# Patient Record
Sex: Male | Born: 1965
Health system: Southern US, Community
[De-identification: ages and names within clinical notes are randomized; demographics above are authoritative.]

## PROBLEM LIST (undated history)

## (undated) DIAGNOSIS — T7840XA Allergy, unspecified, initial encounter: Secondary | ICD-10-CM

## (undated) HISTORY — PX: WISDOM TOOTH EXTRACTION: SHX21

## (undated) HISTORY — PX: INGUINAL HERNIA REPAIR: SUR1180

## (undated) HISTORY — DX: Allergy, unspecified, initial encounter: T78.40XA

---

## 2006-11-03 ENCOUNTER — Ambulatory Visit (HOSPITAL_COMMUNITY): Admission: RE | Admit: 2006-11-03 | Discharge: 2006-11-03 | Payer: Self-pay | Admitting: Cardiology

## 2007-10-24 ENCOUNTER — Encounter: Admission: RE | Admit: 2007-10-24 | Discharge: 2007-10-24 | Payer: Self-pay | Admitting: Surgery

## 2016-05-27 DIAGNOSIS — M7651 Patellar tendinitis, right knee: Secondary | ICD-10-CM | POA: Diagnosis not present

## 2016-12-08 DIAGNOSIS — S81802A Unspecified open wound, left lower leg, initial encounter: Secondary | ICD-10-CM | POA: Diagnosis not present

## 2016-12-12 DIAGNOSIS — S81812A Laceration without foreign body, left lower leg, initial encounter: Secondary | ICD-10-CM | POA: Diagnosis not present

## 2017-01-12 DIAGNOSIS — Z125 Encounter for screening for malignant neoplasm of prostate: Secondary | ICD-10-CM | POA: Diagnosis not present

## 2017-01-12 DIAGNOSIS — Z0000 Encounter for general adult medical examination without abnormal findings: Secondary | ICD-10-CM | POA: Diagnosis not present

## 2017-01-18 DIAGNOSIS — Z6824 Body mass index (BMI) 24.0-24.9, adult: Secondary | ICD-10-CM | POA: Diagnosis not present

## 2017-01-18 DIAGNOSIS — Z1389 Encounter for screening for other disorder: Secondary | ICD-10-CM | POA: Diagnosis not present

## 2017-01-18 DIAGNOSIS — Z0000 Encounter for general adult medical examination without abnormal findings: Secondary | ICD-10-CM | POA: Diagnosis not present

## 2017-08-28 DIAGNOSIS — H10021 Other mucopurulent conjunctivitis, right eye: Secondary | ICD-10-CM | POA: Diagnosis not present

## 2017-08-28 DIAGNOSIS — J0100 Acute maxillary sinusitis, unspecified: Secondary | ICD-10-CM | POA: Diagnosis not present

## 2017-09-08 ENCOUNTER — Encounter: Payer: Self-pay | Admitting: Internal Medicine

## 2018-01-23 DIAGNOSIS — Z125 Encounter for screening for malignant neoplasm of prostate: Secondary | ICD-10-CM | POA: Diagnosis not present

## 2018-01-23 DIAGNOSIS — R82998 Other abnormal findings in urine: Secondary | ICD-10-CM | POA: Diagnosis not present

## 2018-01-23 DIAGNOSIS — Z0000 Encounter for general adult medical examination without abnormal findings: Secondary | ICD-10-CM | POA: Diagnosis not present

## 2018-01-30 DIAGNOSIS — Z0000 Encounter for general adult medical examination without abnormal findings: Secondary | ICD-10-CM | POA: Diagnosis not present

## 2018-01-30 DIAGNOSIS — G5760 Lesion of plantar nerve, unspecified lower limb: Secondary | ICD-10-CM | POA: Diagnosis not present

## 2018-02-02 DIAGNOSIS — Z1212 Encounter for screening for malignant neoplasm of rectum: Secondary | ICD-10-CM | POA: Diagnosis not present

## 2018-02-20 DIAGNOSIS — Z7289 Other problems related to lifestyle: Secondary | ICD-10-CM | POA: Diagnosis not present

## 2018-02-20 DIAGNOSIS — J343 Hypertrophy of nasal turbinates: Secondary | ICD-10-CM | POA: Diagnosis not present

## 2018-02-20 DIAGNOSIS — J342 Deviated nasal septum: Secondary | ICD-10-CM | POA: Diagnosis not present

## 2018-05-13 DIAGNOSIS — Z23 Encounter for immunization: Secondary | ICD-10-CM | POA: Diagnosis not present

## 2018-06-28 ENCOUNTER — Encounter: Payer: Self-pay | Admitting: Gastroenterology

## 2018-07-30 ENCOUNTER — Telehealth: Payer: Self-pay | Admitting: Gastroenterology

## 2018-07-30 ENCOUNTER — Telehealth: Payer: Self-pay

## 2018-07-30 NOTE — Telephone Encounter (Signed)
In an effort to slow community spread of COVID-19 by decreasing the number of people coming to the office, I am reviewing charts of my upcoming clinic visits.  When appropriate, I am offering other options including, but are not limited to:  -  Telephone visits -  Rescheduling in-person clinic visits, timing of which to be determined by each patient's clinical scenario and required social distancing and/or quarantine recommendations from government authorities.   After an initial review of this patient's referral and pertinent chart information, I have decided:  Telephone visit with me 07/31/18 at 2.45PM  Patient should be available by phone at that time and in a location where they can speak in privacy to protect their health information.

## 2018-07-30 NOTE — Telephone Encounter (Signed)
Left message for pt that Dr. Myrtie Neither will have a telephone visit with him tomorrow at 2:45pm due to covid-19. Left message for pt to call back and reschedule his visit if he does not want a phone visit.

## 2018-07-31 ENCOUNTER — Other Ambulatory Visit: Payer: Self-pay

## 2018-07-31 ENCOUNTER — Telehealth (INDEPENDENT_AMBULATORY_CARE_PROVIDER_SITE_OTHER): Payer: Self-pay | Admitting: Gastroenterology

## 2018-07-31 DIAGNOSIS — R194 Change in bowel habit: Secondary | ICD-10-CM

## 2018-07-31 NOTE — Patient Instructions (Signed)
You will need colonoscopy for screening purposes around 09/30/18 or before. We will be in contact with you regarding getting this procedure scheduled as soon as possible. Should you not hear from [place] within the next 4 weeks, please call us at [phone].  It was a pleasure to speak you today!   Dr. Myrtie Neither

## 2018-07-31 NOTE — Telephone Encounter (Signed)
Left message for pt regarding phone visit and time.

## 2018-07-31 NOTE — Progress Notes (Signed)
This patient contacted our office requesting a physician telephone consultation regarding clinical questions and/or test results.  The patient and I were the only participants on the phone call, and the patient consented to phone consultation.  Chief complaint: Possible rectal bleeding  Relevant history and results: Mr. Caris was initially referred by his primary care provider screening colonoscopy.  He is a healthy man who takes no medications and has no chronic medical problems. When he spoke to our office to schedule procedure, he was asked if he had had any bleeding.  He reported that on February 19 he had had a dark red stool "like cranberry", having no abdominal pain or altered bowel habit at that time.  He had no chronic abdominal pain, has never seen any possible or definite bleeding prior to that, no such episodes since then.  He suspects it may even have been something that he ate the change the color of his stool.   Assessment and plan: A single episode of altered stool character, not clear that it was really bleeding.  He is in need of a screening colonoscopy.  In the event that what he described had been bleeding, we will get it scheduled within 8 weeks.  We are currently delaying nonurgent procedures for at least 4 weeks due to COVID-19.  This encounter will be sent to clinical staff to contact the patient and schedule the colonoscopy.  Mr. Loiseau was also asked to call us if he has not received a phone call from our clinic within 4 weeks.  Total encounter time: 11 minutes   Amada Jupiter, MD

## 2018-08-30 ENCOUNTER — Telehealth: Payer: Self-pay | Admitting: *Deleted

## 2018-08-30 NOTE — Telephone Encounter (Signed)
I have left a message for patient to call back. We would like to schedule him for colonoscopy on 09/07/18 with Dr Myrtie Neither (per Dr Myrtie Neither 07/31/18 televisit encounter, patient needed colonoscopy prior to 09/30/18 for screening purposes/single episode of altered stool character). He will need set up for previsit call as well.

## 2018-08-31 NOTE — Telephone Encounter (Signed)
Left message for patient to call back  

## 2018-09-04 NOTE — Telephone Encounter (Signed)
Patient is scheduled for 09/25/18 colonoscopy with Dr Myrtie Neither.

## 2018-09-07 ENCOUNTER — Telehealth: Payer: Self-pay | Admitting: *Deleted

## 2018-09-07 NOTE — Telephone Encounter (Signed)
Pt called back and spoke with Lasana. He confirmed his pv phone call appointment.

## 2018-09-07 NOTE — Telephone Encounter (Signed)
Called patient, no answer. Left a message for him to call me back today. He needs to confirm his pre-visit scheduled for Sep 11, 2018 at 330 pm.

## 2018-09-11 ENCOUNTER — Encounter: Payer: Self-pay | Admitting: Gastroenterology

## 2018-09-11 ENCOUNTER — Ambulatory Visit: Payer: 59 | Admitting: *Deleted

## 2018-09-11 ENCOUNTER — Other Ambulatory Visit: Payer: Self-pay

## 2018-09-11 VITALS — Ht 68.0 in | Wt 155.0 lb

## 2018-09-11 DIAGNOSIS — Z1211 Encounter for screening for malignant neoplasm of colon: Secondary | ICD-10-CM

## 2018-09-11 MED ORDER — PEG-KCL-NACL-NASULF-NA ASC-C 140 G PO SOLR: 1.0000 | ORAL | 0 refills | Status: DC

## 2018-09-11 NOTE — Progress Notes (Signed)
No egg or soy allergy known to patient  No issues with past sedation with any surgeries  or procedures, no intubation problems  No diet pills per patient No home 02 use per patient  No blood thinners per patient  Pt denies issues with constipation  No A fib or A flutter  EMMI video sent to pt's e mail   Pt mailed instruction packet to included paper to complete and mail back to [facility] with addressed and stamped envelope, Emmi video, copy of consent form to read and not return, and instructions. [employer]  coupon mailed in packet. PV completed over the phone. Pt encouraged to call with questions or issues

## 2018-09-25 ENCOUNTER — Encounter: Payer: Self-pay | Admitting: Gastroenterology

## 2018-10-13 ENCOUNTER — Telehealth: Payer: Self-pay | Admitting: *Deleted

## 2018-10-13 NOTE — Telephone Encounter (Signed)
Covid-19 screening questions  Have you traveled in the last 14 days? No. If yes where?  Do you now or have you had a fever in the last 14 days? No.  Do you have any respiratory symptoms of shortness of breath or cough now or in the last 14 days? No.  Do you have any family members or close contacts with diagnosed or suspected Covid-19 in the past 14 days? No.  Have you been tested for Covid-19 and found to be positive? No.       

## 2018-10-16 ENCOUNTER — Other Ambulatory Visit: Payer: Self-pay

## 2018-10-16 ENCOUNTER — Encounter: Payer: Self-pay | Admitting: Gastroenterology

## 2018-10-16 ENCOUNTER — Ambulatory Visit (AMBULATORY_SURGERY_CENTER): Payer: 59 | Admitting: Gastroenterology

## 2018-10-16 VITALS — BP 100/60 | HR 65 | Temp 98.5°F | Resp 15 | Ht 68.0 in | Wt 155.0 lb

## 2018-10-16 DIAGNOSIS — Z1211 Encounter for screening for malignant neoplasm of colon: Secondary | ICD-10-CM | POA: Diagnosis present

## 2018-10-16 MED ORDER — SODIUM CHLORIDE 0.9 % IV SOLN
500.0000 mL | Freq: Once | INTRAVENOUS | Status: DC
Start: 2018-10-16 — End: 2018-10-16

## 2018-10-16 NOTE — Patient Instructions (Addendum)
You will need a repeat colonscopy in 10 years.  YOU HAD AN ENDOSCOPIC PROCEDURE TODAY AT [facility]:   Refer to the procedure report that was given to you for any specific questions about what was found during the examination.  If the procedure report does not answer your questions, please call your gastroenterologist to clarify.  If you requested that your care partner not be given the details of your procedure findings, then the procedure report has been included in a sealed envelope for you to review at your convenience later.  YOU SHOULD EXPECT: Some feelings of bloating in the abdomen. Passage of more gas than usual.  Walking can help get rid of the air that was put into your GI tract during the procedure and reduce the bloating. If you had a lower endoscopy (such as a colonoscopy or flexible sigmoidoscopy) you may notice spotting of blood in your stool or on the toilet paper. If you underwent a bowel prep for your procedure, you may not have a normal bowel movement for a few days.  Please Note:  You might notice some irritation and congestion in your nose or some drainage.  This is from the oxygen used during your procedure.  There is no need for concern and it should clear up in a day or so.  SYMPTOMS TO REPORT IMMEDIATELY:   Following lower endoscopy (colonoscopy or flexible sigmoidoscopy):  Excessive amounts of blood in the stool  Significant tenderness or worsening of abdominal pains  Swelling of the abdomen that is new, acute  Fever of 100F or higher   For urgent or emergent issues, a gastroenterologist can be reached at any hour by calling [phone].   DIET:  We do recommend a small meal at first, but then you may proceed to your regular diet.  Drink plenty of fluids but you should avoid alcoholic beverages for 24 hours.  ACTIVITY:  You should plan to take it easy for the rest of today and you should NOT DRIVE or use heavy machinery until tomorrow (because  of the sedation medicines used during the test).    FOLLOW UP: Our staff will call the number listed on your records 48-72 hours following your procedure to check on you and address any questions or concerns that you may have regarding the information given to you following your procedure. If we do not reach you, we will leave a message.  We will attempt to reach you two times.  During this call, we will ask if you have developed any symptoms of COVID 19. If you develop any symptoms (ie: fever, flu-like symptoms, shortness of breath, cough etc.) before then, please call [phone].  If you test positive for Covid 19 in the 2 weeks post procedure, please call and report this information to [place].     SIGNATURES/CONFIDENTIALITY: You and/or your care partner have signed paperwork which will be entered into your electronic medical record.  These signatures attest to the fact that that the information above on your After Visit Summary has been reviewed and is understood.  Full responsibility of the confidentiality of this discharge information lies with you and/or your care-partner.

## 2018-10-16 NOTE — Progress Notes (Signed)
A/ox3, pleased with MAC, report to RN 

## 2018-10-16 NOTE — Op Note (Signed)
[facility] Patient Name: Walter Gentry Procedure Date: 10/16/2018 9:08 AM MRN: [id number] Endoscopist: Sherilyn CooterHenry L. Myrtie Neitheranis , MD Age: 53 Referring MD:  Date of Birth: 07/08/1965 Gender: Male Account #: [id number] Procedure:                Colonoscopy Indications:              Screening for colorectal malignant neoplasm, This                            is the patient's first colonoscopy Medicines:                Monitored Anesthesia Care Procedure:                Pre-Anesthesia Assessment:                           - Prior to the procedure, a History and Physical                            was performed, and patient medications and                            allergies were reviewed. The patient's tolerance of                            previous anesthesia was also reviewed. The risks                            and benefits of the procedure and the sedation                            options and risks were discussed with the patient.                            All questions were answered, and informed consent                            was obtained. Prior Anticoagulants: The patient has                            taken no previous anticoagulant or antiplatelet                            agents. ASA Grade Assessment: I - A normal, healthy                            patient. After reviewing the risks and benefits,                            the patient was deemed in satisfactory condition to                            undergo the procedure.  After obtaining informed consent, the colonoscope                            was passed under direct vision. Throughout the                            procedure, the patient's blood pressure, pulse, and                            oxygen saturations were monitored continuously. The                            Colonoscope was introduced through the anus and                            advanced to the the cecum, identified by                         appendiceal orifice and ileocecal valve. The                            colonoscopy was performed without difficulty. The                            patient tolerated the procedure well. The quality                            of the bowel preparation was excellent. The                            ileocecal valve, appendiceal orifice, and rectum                            were photographed. Scope In: 9:27:03 AM Scope Out: 9:45:02 AM Scope Withdrawal Time: 0 hours 11 minutes 31 seconds  Total Procedure Duration: 0 hours 17 minutes 59 seconds  Findings:                 The perianal and digital rectal examinations were                            normal.                           The entire examined colon appeared normal on direct                            and retroflexion views. Complications:            No immediate complications. Estimated Blood Loss:     Estimated blood loss: none. Impression:               - The entire examined colon is normal on direct and                            retroflexion views.                           -  No specimens collected. Recommendation:           - Patient has a contact number available for                            emergencies. The signs and symptoms of potential                            delayed complications were discussed with the                            patient. Return to normal activities tomorrow.                            Written discharge instructions were provided to the                            patient.                           - Resume previous diet.                           - Continue present medications.                           - Repeat colonoscopy in 10 years for screening                            purposes. Henry L. Myrtie Neitheranis, MD 10/16/2018 9:46:54 AM This report has been signed electronically.

## 2018-10-18 ENCOUNTER — Telehealth: Payer: Self-pay | Admitting: *Deleted

## 2018-10-18 NOTE — Telephone Encounter (Signed)
  Follow up Call-  Call back number 10/16/2018  Post procedure Call Back phone  # [phone]  Permission to leave phone message Yes  Some recent data might be hidden     Patient questions:  Do you have a fever, pain , or abdominal swelling? No. Pain Score  0 *  Have you tolerated food without any problems? Yes.    Have you been able to return to your normal activities? Yes.    Do you have any questions about your discharge instructions: Diet   No. Medications  No. Follow up visit  No.  Do you have questions or concerns about your Care? No.  Actions: * If pain score is 4 or above: No action needed, pain <4.   1. Have you developed a fever since your procedure? no  2.   Have you had an respiratory symptoms (SOB or cough) since your procedure? no  3.   Have you tested positive for COVID 19 since your procedure no  4.   Have you had any family members/close contacts diagnosed with the COVID 19 since your procedure?  no   If yes to any of these questions please route to Joylene John, RN and Alphonsa Gin, Therapist, sports.

## 2018-11-02 ENCOUNTER — Other Ambulatory Visit: Payer: Self-pay | Admitting: Internal Medicine

## 2018-11-02 ENCOUNTER — Other Ambulatory Visit: Payer: 59

## 2018-11-02 DIAGNOSIS — Z20822 Contact with and (suspected) exposure to covid-19: Secondary | ICD-10-CM

## 2018-11-02 DIAGNOSIS — Z20828 Contact with and (suspected) exposure to other viral communicable diseases: Secondary | ICD-10-CM

## 2018-11-06 ENCOUNTER — Telehealth: Payer: Self-pay | Admitting: *Deleted

## 2018-11-06 NOTE — Telephone Encounter (Signed)
Patient and pt's wife calling to see if COVID testing results were available. Testing was performed on Thursday morning. Pt and pt's wife advised that the turn around time for receiving results was now between 5-7 days. Advised that pt will be contacted once results were received from the lab by a clinical team member of [facility]. Notified pt that results would also be available in [facility]. Understanding verbalized.

## 2018-11-07 LAB — NOVEL CORONAVIRUS, NAA: SARS-CoV-2, NAA: NOT DETECTED

## 2021-03-17 ENCOUNTER — Other Ambulatory Visit: Payer: Self-pay | Admitting: Internal Medicine

## 2021-03-17 DIAGNOSIS — Z8249 Family history of ischemic heart disease and other diseases of the circulatory system: Secondary | ICD-10-CM

## 2021-04-24 ENCOUNTER — Ambulatory Visit
Admission: RE | Admit: 2021-04-24 | Discharge: 2021-04-24 | Disposition: A | Discharge status: Home / Self Care | Payer: No Typology Code available for payment source | Source: Ambulatory Visit | Attending: Internal Medicine | Admitting: Internal Medicine

## 2021-04-24 DIAGNOSIS — Z8249 Family history of ischemic heart disease and other diseases of the circulatory system: Secondary | ICD-10-CM

## 2022-09-16 IMAGING — CT CT CARDIAC CORONARY ARTERY CALCIUM SCORE
3 series · 14 of 20 positions shown, 16 images · non-contrast
Comparison: Prior calcium score and coronary CTA study on
11/03/2006

CLINICAL DATA: 55-year-old Caucasian male with family history of
heart disease.

EXAM:
CT CARDIAC CORONARY ARTERY CALCIUM SCORE
TECHNIQUE: Non-contrast imaging through the heart was performed using
prospective ECG gating. Image post processing was performed on an
independent workstation, allowing for quantitative analysis of the
heart and coronary arteries. Note that this exam targets the heart
and the chest was not imaged in its entirety.

[Series 2: calcium scoring 2.00 qr36 bestdiast 69% hrt calciu · axial · 0.34mm/px · z∈[+1705,+1799]mm · 4 of 79 slices shown]
[im 16/79  vessel]
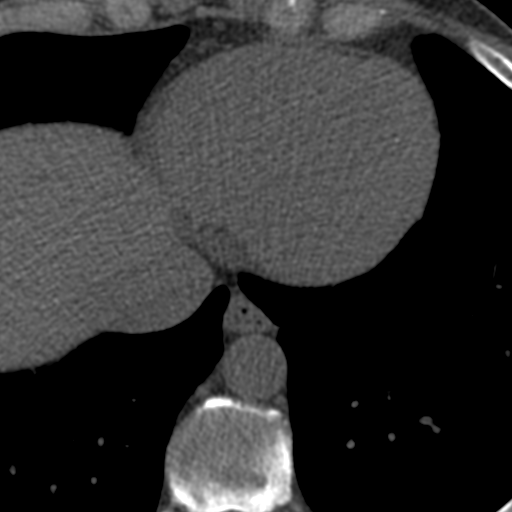
[im 32/79  vessel]
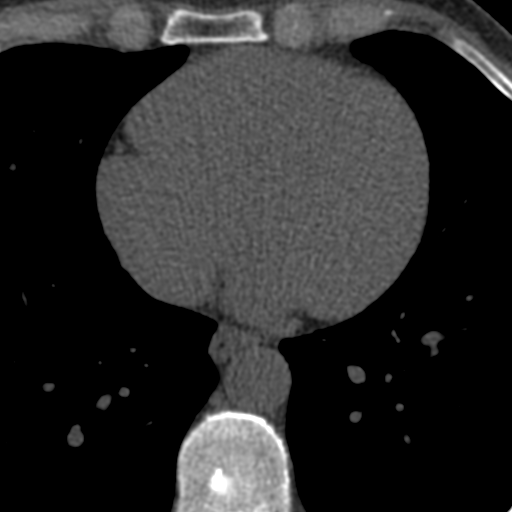
[im 47/79  vessel]
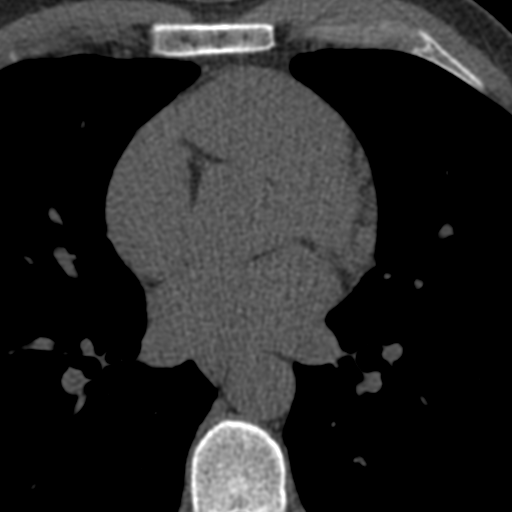
[im 63/79  vessel]
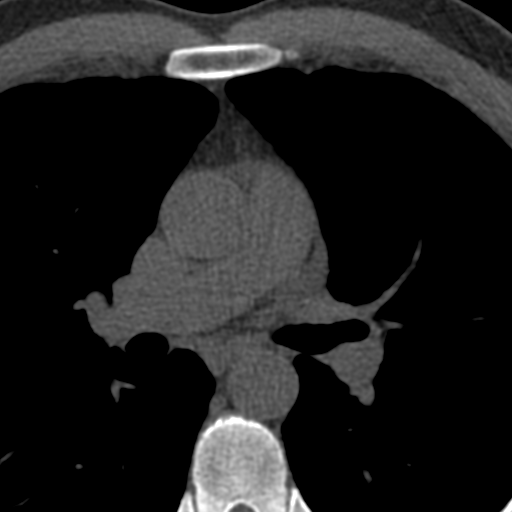

[Series 3: calcium scoring 2.00 br40 bestdiast 69% axial · axial · 0.53mm/px · z∈[+1699,+1803]mm · 5 of 80 slices shown, 7 images]
[im 14/80  vessel]
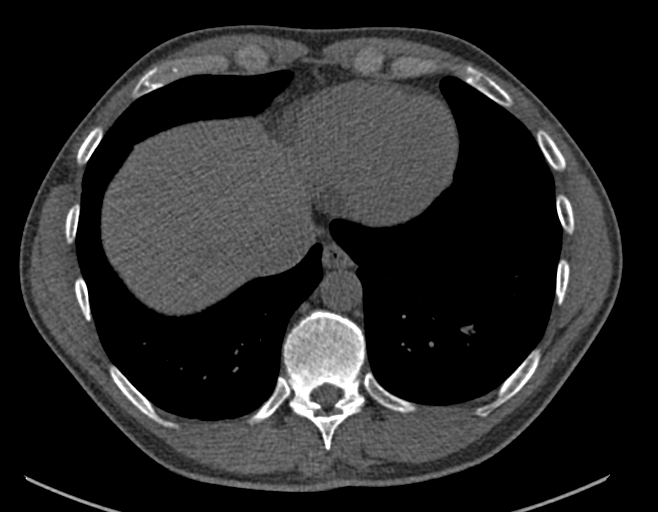
[im 14/80  lung]
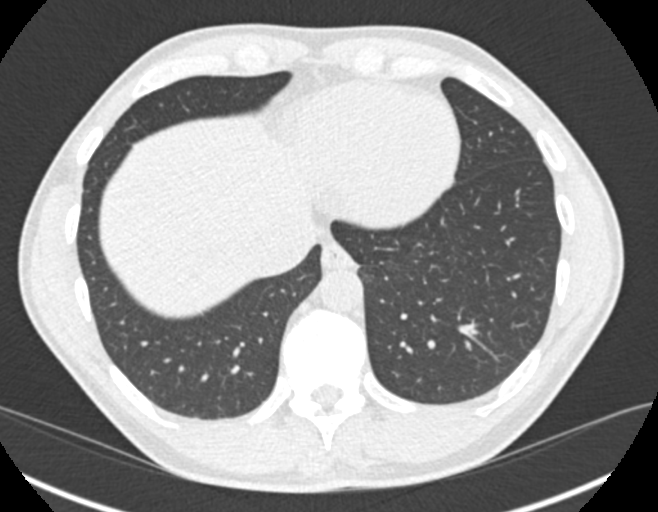
[im 27/80  vessel]
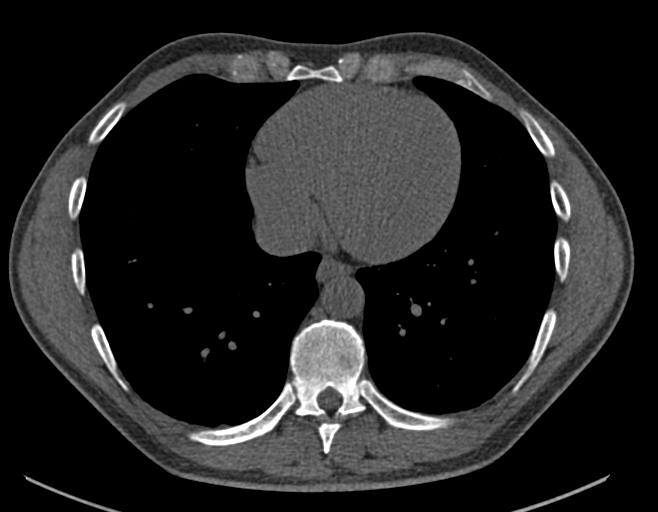
[im 40/80  vessel]
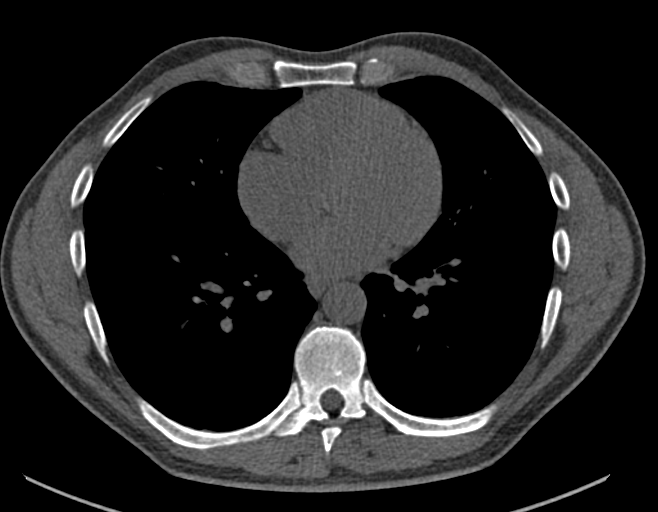
[im 53/80  vessel]
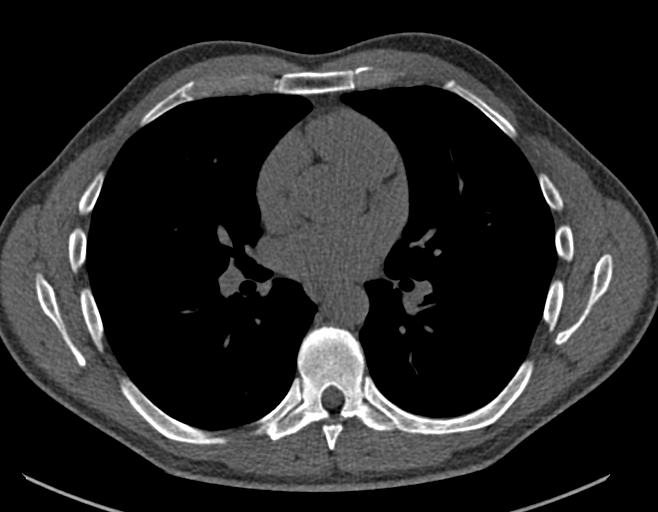
[im 66/80  vessel]
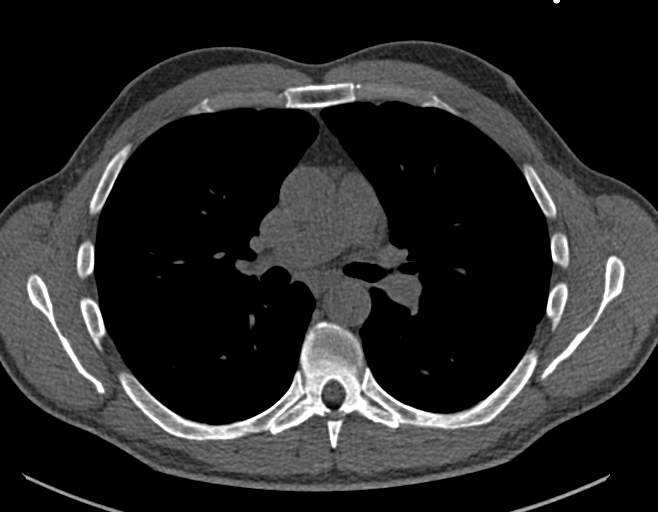
[im 66/80  lung]
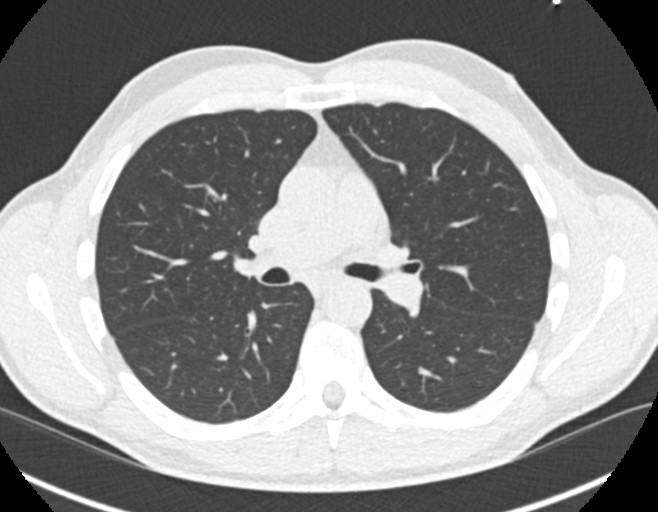

[Series 9: calcium scoring 2.00 br60 bestdiast 69% lungs · axial · 0.53mm/px · z∈[+1699,+1803]mm · 5 of 80 slices shown]
[im 14/80  vessel]
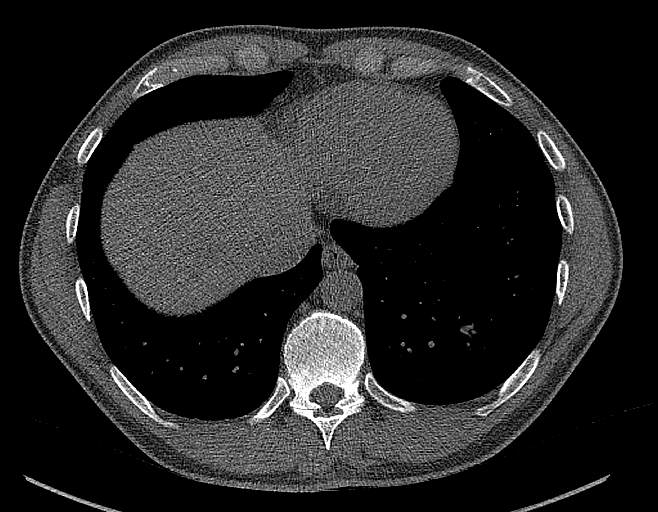
[im 27/80  vessel]
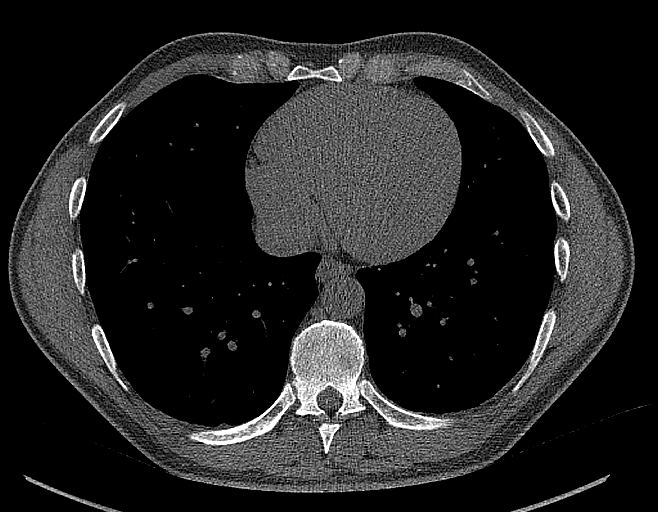
[im 40/80  vessel]
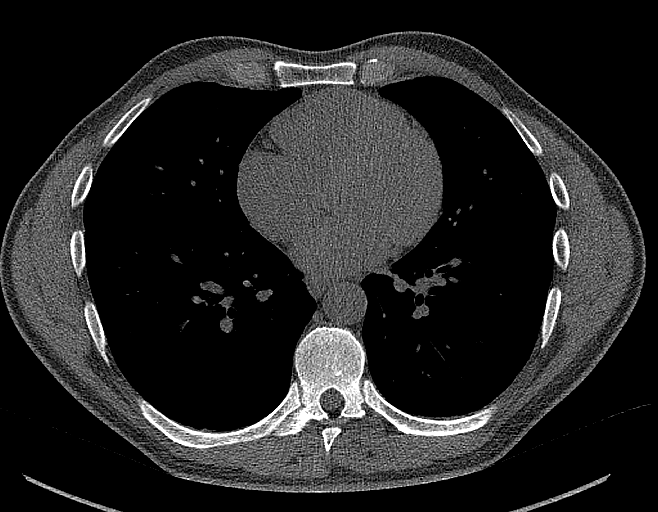
[im 53/80  vessel]
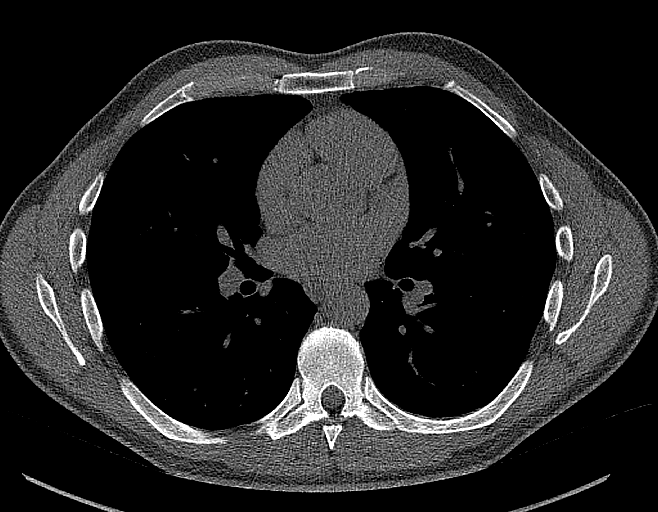
[im 66/80  vessel]
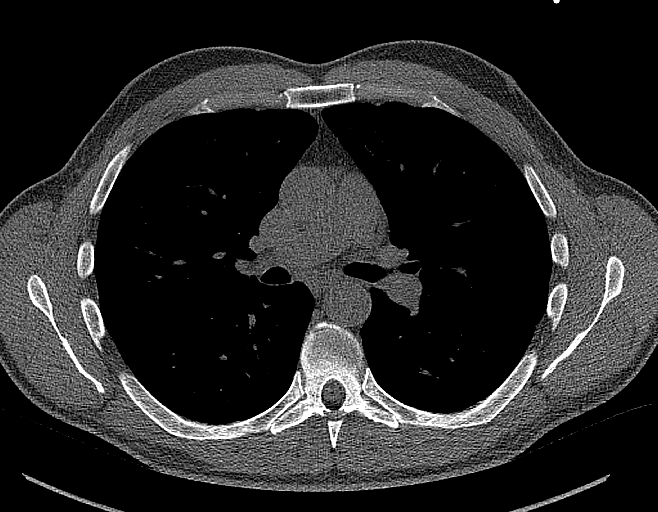

[14 of 20 positions shown; findings below may reference images not displayed]

FINDINGS: CORONARY CALCIUM SCORES:

Left Main: 0

LAD: 0

LCx: 0

RCA: 0

Total Agatston Score: 0

[HOSPITAL] percentile: 0

AORTA MEASUREMENTS:

Ascending Aorta: 30 mm

Descending Aorta: 23 mm

OTHER FINDINGS:

